# Patient Record
Sex: Female | Born: 1990 | Race: White | Hispanic: No | Marital: Single | Smoking: Never smoker
Health system: Southern US, Community
[De-identification: ages and names within clinical notes are randomized; demographics above are authoritative.]

---

## 2014-03-16 DIAGNOSIS — D229 Melanocytic nevi, unspecified: Secondary | ICD-10-CM

## 2014-03-16 HISTORY — DX: Melanocytic nevi, unspecified: D22.9

## 2016-05-25 DIAGNOSIS — N898 Other specified noninflammatory disorders of vagina: Secondary | ICD-10-CM | POA: Diagnosis not present

## 2016-06-21 DIAGNOSIS — Z3202 Encounter for pregnancy test, result negative: Secondary | ICD-10-CM | POA: Diagnosis not present

## 2016-06-21 DIAGNOSIS — Z23 Encounter for immunization: Secondary | ICD-10-CM | POA: Diagnosis not present

## 2016-08-21 DIAGNOSIS — Z23 Encounter for immunization: Secondary | ICD-10-CM | POA: Diagnosis not present

## 2016-08-21 DIAGNOSIS — Z6823 Body mass index (BMI) 23.0-23.9, adult: Secondary | ICD-10-CM | POA: Diagnosis not present

## 2016-10-31 DIAGNOSIS — F4322 Adjustment disorder with anxiety: Secondary | ICD-10-CM | POA: Diagnosis not present

## 2016-11-08 DIAGNOSIS — F4322 Adjustment disorder with anxiety: Secondary | ICD-10-CM | POA: Diagnosis not present

## 2016-11-14 DIAGNOSIS — F4322 Adjustment disorder with anxiety: Secondary | ICD-10-CM | POA: Diagnosis not present

## 2016-11-22 DIAGNOSIS — F4322 Adjustment disorder with anxiety: Secondary | ICD-10-CM | POA: Diagnosis not present

## 2016-11-26 DIAGNOSIS — F4322 Adjustment disorder with anxiety: Secondary | ICD-10-CM | POA: Diagnosis not present

## 2016-12-03 DIAGNOSIS — F4322 Adjustment disorder with anxiety: Secondary | ICD-10-CM | POA: Diagnosis not present

## 2016-12-25 DIAGNOSIS — F4322 Adjustment disorder with anxiety: Secondary | ICD-10-CM | POA: Diagnosis not present

## 2017-01-08 DIAGNOSIS — F4322 Adjustment disorder with anxiety: Secondary | ICD-10-CM | POA: Diagnosis not present

## 2017-01-22 DIAGNOSIS — F4322 Adjustment disorder with anxiety: Secondary | ICD-10-CM | POA: Diagnosis not present

## 2017-02-05 DIAGNOSIS — Z6823 Body mass index (BMI) 23.0-23.9, adult: Secondary | ICD-10-CM | POA: Diagnosis not present

## 2017-02-05 DIAGNOSIS — Z01419 Encounter for gynecological examination (general) (routine) without abnormal findings: Secondary | ICD-10-CM | POA: Diagnosis not present

## 2017-02-05 DIAGNOSIS — Z23 Encounter for immunization: Secondary | ICD-10-CM | POA: Diagnosis not present

## 2017-02-05 DIAGNOSIS — F4322 Adjustment disorder with anxiety: Secondary | ICD-10-CM | POA: Diagnosis not present

## 2017-02-05 DIAGNOSIS — Z113 Encounter for screening for infections with a predominantly sexual mode of transmission: Secondary | ICD-10-CM | POA: Diagnosis not present

## 2017-02-19 DIAGNOSIS — F4322 Adjustment disorder with anxiety: Secondary | ICD-10-CM | POA: Diagnosis not present

## 2017-03-12 DIAGNOSIS — F4322 Adjustment disorder with anxiety: Secondary | ICD-10-CM | POA: Diagnosis not present

## 2017-04-04 DIAGNOSIS — F4322 Adjustment disorder with anxiety: Secondary | ICD-10-CM | POA: Diagnosis not present

## 2017-04-18 DIAGNOSIS — J029 Acute pharyngitis, unspecified: Secondary | ICD-10-CM | POA: Diagnosis not present

## 2017-05-08 DIAGNOSIS — F4322 Adjustment disorder with anxiety: Secondary | ICD-10-CM | POA: Diagnosis not present

## 2017-05-13 ENCOUNTER — Other Ambulatory Visit: Payer: Self-pay | Admitting: Family Medicine

## 2017-05-13 ENCOUNTER — Ambulatory Visit
Admission: RE | Admit: 2017-05-13 | Discharge: 2017-05-13 | Disposition: A | Discharge status: Home / Self Care | Payer: BLUE CROSS/BLUE SHIELD | Source: Ambulatory Visit | Attending: Family Medicine | Admitting: Family Medicine

## 2017-05-13 DIAGNOSIS — K5900 Constipation, unspecified: Principal | ICD-10-CM

## 2017-05-13 DIAGNOSIS — K5909 Other constipation: Secondary | ICD-10-CM

## 2017-05-13 DIAGNOSIS — R109 Unspecified abdominal pain: Secondary | ICD-10-CM | POA: Diagnosis not present

## 2017-05-13 DIAGNOSIS — K219 Gastro-esophageal reflux disease without esophagitis: Secondary | ICD-10-CM | POA: Diagnosis not present

## 2017-05-13 DIAGNOSIS — Z6824 Body mass index (BMI) 24.0-24.9, adult: Secondary | ICD-10-CM | POA: Diagnosis not present

## 2017-05-13 DIAGNOSIS — R140 Abdominal distension (gaseous): Secondary | ICD-10-CM | POA: Diagnosis not present

## 2017-05-13 DIAGNOSIS — Z3041 Encounter for surveillance of contraceptive pills: Secondary | ICD-10-CM | POA: Diagnosis not present

## 2017-05-13 DIAGNOSIS — R197 Diarrhea, unspecified: Secondary | ICD-10-CM | POA: Diagnosis not present

## 2017-05-22 DIAGNOSIS — F4322 Adjustment disorder with anxiety: Secondary | ICD-10-CM | POA: Diagnosis not present

## 2017-05-23 DIAGNOSIS — R140 Abdominal distension (gaseous): Secondary | ICD-10-CM | POA: Diagnosis not present

## 2017-05-23 DIAGNOSIS — Z6824 Body mass index (BMI) 24.0-24.9, adult: Secondary | ICD-10-CM | POA: Diagnosis not present

## 2017-05-23 DIAGNOSIS — K5900 Constipation, unspecified: Secondary | ICD-10-CM | POA: Diagnosis not present

## 2017-05-23 DIAGNOSIS — A048 Other specified bacterial intestinal infections: Secondary | ICD-10-CM | POA: Diagnosis not present

## 2017-06-12 DIAGNOSIS — F4322 Adjustment disorder with anxiety: Secondary | ICD-10-CM | POA: Diagnosis not present

## 2017-06-18 DIAGNOSIS — K219 Gastro-esophageal reflux disease without esophagitis: Secondary | ICD-10-CM | POA: Diagnosis not present

## 2017-06-18 DIAGNOSIS — A048 Other specified bacterial intestinal infections: Secondary | ICD-10-CM | POA: Diagnosis not present

## 2017-06-18 DIAGNOSIS — K5900 Constipation, unspecified: Secondary | ICD-10-CM | POA: Diagnosis not present

## 2017-06-18 DIAGNOSIS — Z6824 Body mass index (BMI) 24.0-24.9, adult: Secondary | ICD-10-CM | POA: Diagnosis not present

## 2017-06-26 DIAGNOSIS — F4322 Adjustment disorder with anxiety: Secondary | ICD-10-CM | POA: Diagnosis not present

## 2017-07-08 DIAGNOSIS — F4322 Adjustment disorder with anxiety: Secondary | ICD-10-CM | POA: Diagnosis not present

## 2017-07-31 DIAGNOSIS — F4322 Adjustment disorder with anxiety: Secondary | ICD-10-CM | POA: Diagnosis not present

## 2017-08-14 DIAGNOSIS — F4322 Adjustment disorder with anxiety: Secondary | ICD-10-CM | POA: Diagnosis not present

## 2017-09-17 DIAGNOSIS — F4322 Adjustment disorder with anxiety: Secondary | ICD-10-CM | POA: Diagnosis not present

## 2017-11-04 DIAGNOSIS — F4322 Adjustment disorder with anxiety: Secondary | ICD-10-CM | POA: Diagnosis not present

## 2017-11-12 DIAGNOSIS — F4322 Adjustment disorder with anxiety: Secondary | ICD-10-CM | POA: Diagnosis not present

## 2017-12-19 DIAGNOSIS — F4322 Adjustment disorder with anxiety: Secondary | ICD-10-CM | POA: Diagnosis not present

## 2017-12-31 DIAGNOSIS — F4322 Adjustment disorder with anxiety: Secondary | ICD-10-CM | POA: Diagnosis not present

## 2018-01-21 DIAGNOSIS — F4322 Adjustment disorder with anxiety: Secondary | ICD-10-CM | POA: Diagnosis not present

## 2018-02-13 DIAGNOSIS — F4322 Adjustment disorder with anxiety: Secondary | ICD-10-CM | POA: Diagnosis not present

## 2018-02-24 DIAGNOSIS — Z6825 Body mass index (BMI) 25.0-25.9, adult: Secondary | ICD-10-CM | POA: Diagnosis not present

## 2018-02-24 DIAGNOSIS — Z01419 Encounter for gynecological examination (general) (routine) without abnormal findings: Secondary | ICD-10-CM | POA: Diagnosis not present

## 2018-03-11 DIAGNOSIS — F4322 Adjustment disorder with anxiety: Secondary | ICD-10-CM | POA: Diagnosis not present

## 2018-04-21 DIAGNOSIS — K581 Irritable bowel syndrome with constipation: Secondary | ICD-10-CM | POA: Diagnosis not present

## 2018-10-23 IMAGING — CR DG ABDOMEN 2V
2 series · 2 of 2 positions shown · non-contrast
Comparison: None.

CLINICAL DATA: Episodes of bloating, abd discomfort,and
constipation x 2 years / no chance PG / concern for stool burden /
jdh [id number]

EXAM:
ABDOMEN - 2 VIEW

[w abdomen upright]
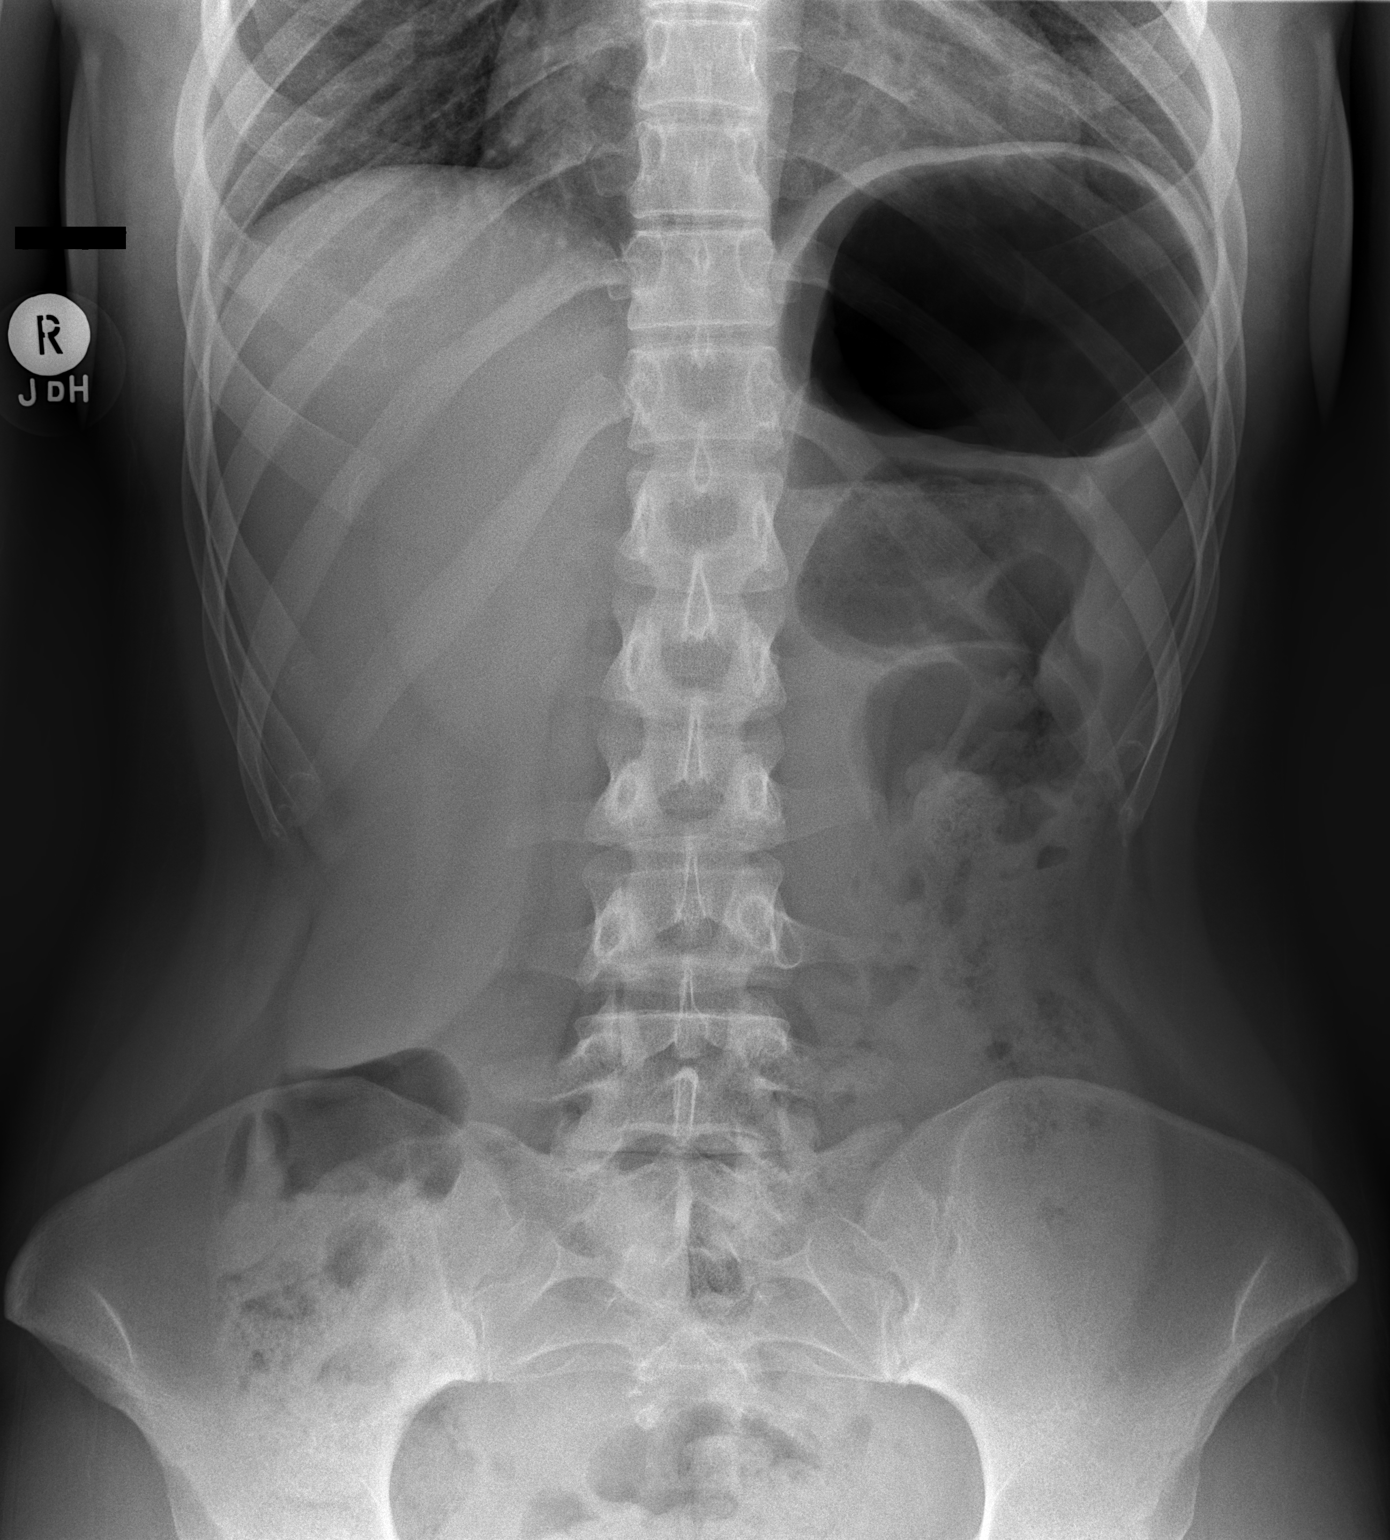

[t abdomen supine]
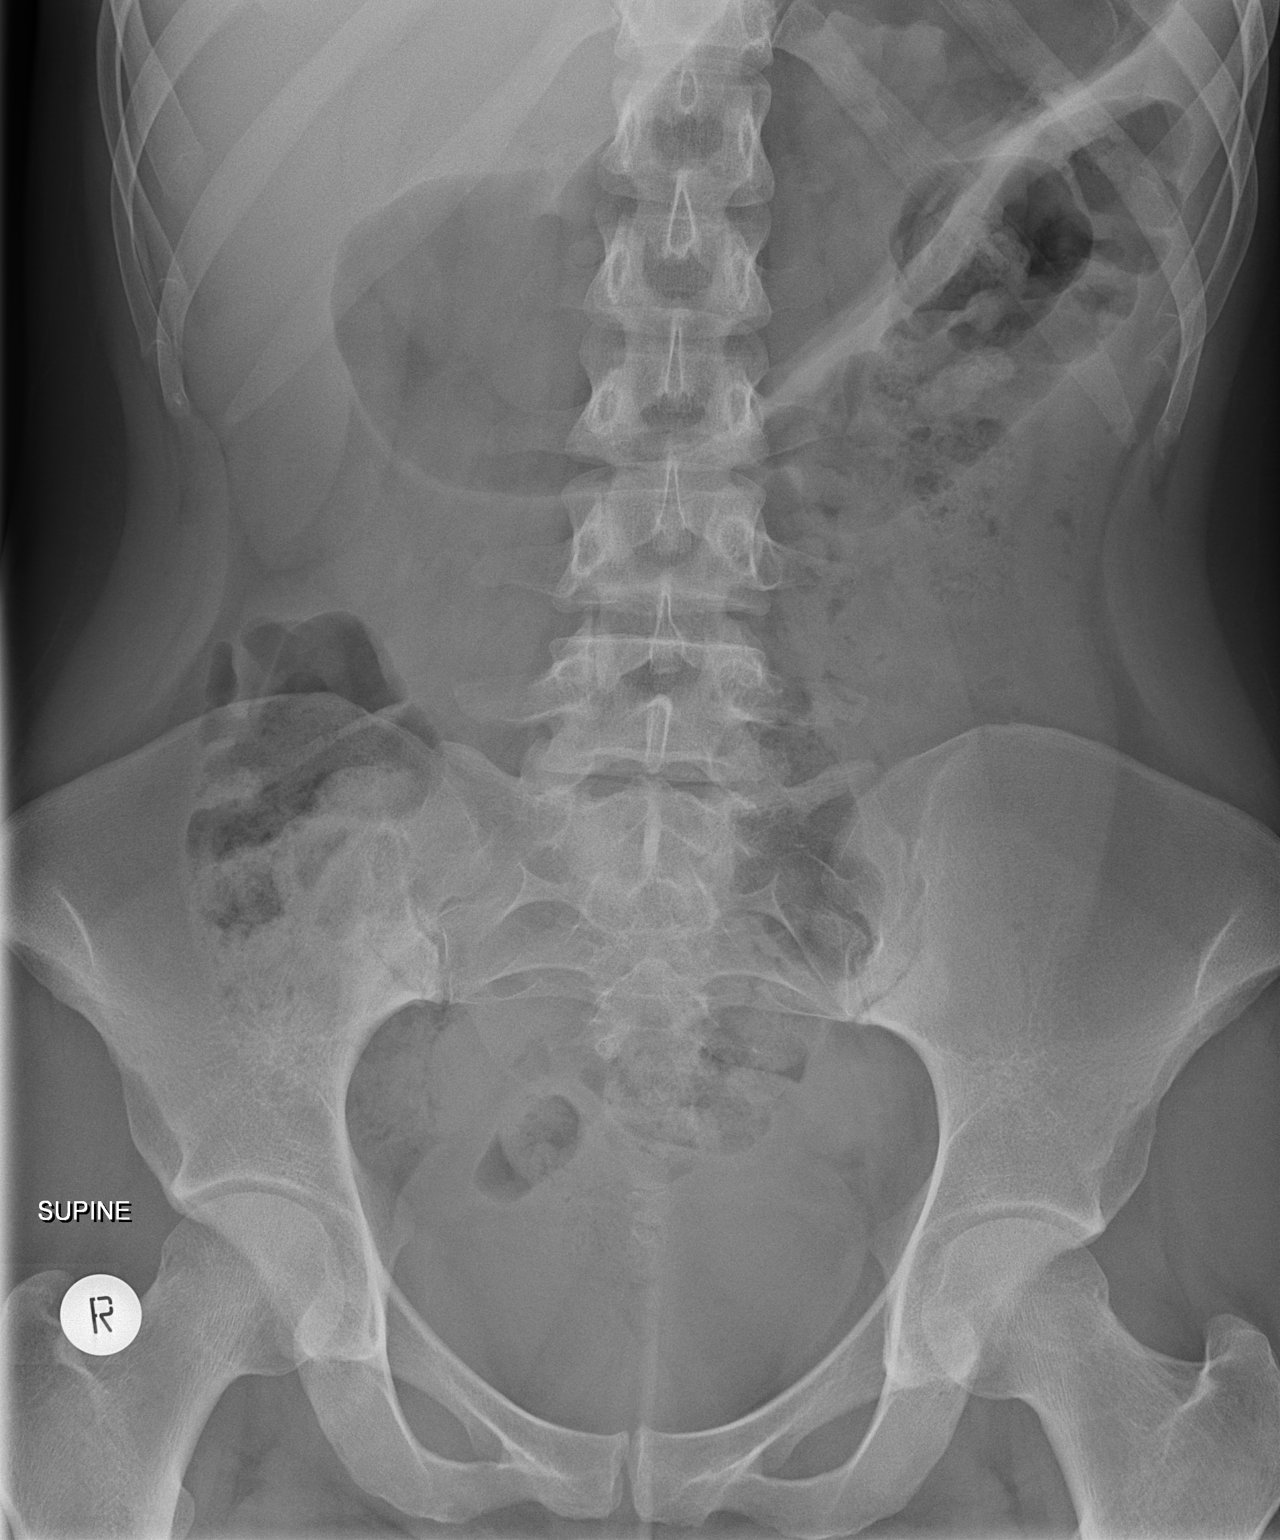

[2 of 2 positions shown; findings below may reference images not displayed]

FINDINGS: Visualized lung bases clear.

No free air.

Stomach is distended by gas and ingested material. Small bowel
decompressed. Moderate fecal material in the proximal colon,
decompressed distally. No abnormal abdominal calcifications.

Regional bones unremarkable.
IMPRESSION: Nonobstructive bowel gas pattern with moderate proximal colonic
fecal material.

## 2018-12-22 DIAGNOSIS — Z20828 Contact with and (suspected) exposure to other viral communicable diseases: Secondary | ICD-10-CM | POA: Diagnosis not present

## 2019-02-14 DIAGNOSIS — Z20828 Contact with and (suspected) exposure to other viral communicable diseases: Secondary | ICD-10-CM | POA: Diagnosis not present

## 2019-02-14 DIAGNOSIS — R519 Headache, unspecified: Secondary | ICD-10-CM | POA: Diagnosis not present

## 2019-02-14 DIAGNOSIS — R0981 Nasal congestion: Secondary | ICD-10-CM | POA: Diagnosis not present

## 2019-02-14 DIAGNOSIS — R05 Cough: Secondary | ICD-10-CM | POA: Diagnosis not present

## 2019-03-02 DIAGNOSIS — Z6823 Body mass index (BMI) 23.0-23.9, adult: Secondary | ICD-10-CM | POA: Diagnosis not present

## 2019-03-02 DIAGNOSIS — Z124 Encounter for screening for malignant neoplasm of cervix: Secondary | ICD-10-CM | POA: Diagnosis not present

## 2019-03-02 DIAGNOSIS — Z01419 Encounter for gynecological examination (general) (routine) without abnormal findings: Secondary | ICD-10-CM | POA: Diagnosis not present

## 2019-07-28 ENCOUNTER — Other Ambulatory Visit: Payer: Self-pay

## 2019-07-28 ENCOUNTER — Ambulatory Visit (INDEPENDENT_AMBULATORY_CARE_PROVIDER_SITE_OTHER): Payer: BLUE CROSS/BLUE SHIELD | Admitting: Physician Assistant

## 2019-07-28 ENCOUNTER — Encounter: Payer: Self-pay | Admitting: Physician Assistant

## 2019-07-28 DIAGNOSIS — L219 Seborrheic dermatitis, unspecified: Secondary | ICD-10-CM | POA: Diagnosis not present

## 2019-07-28 DIAGNOSIS — L858 Other specified epidermal thickening: Secondary | ICD-10-CM

## 2019-07-28 DIAGNOSIS — Z1283 Encounter for screening for malignant neoplasm of skin: Secondary | ICD-10-CM | POA: Diagnosis not present

## 2019-07-28 MED ORDER — KETOCONAZOLE 2 % EX SHAM: 1.0000 "application " | MEDICATED_SHAMPOO | Freq: Once | CUTANEOUS | 10 refills | Status: AC

## 2019-07-28 MED ORDER — RHOFADE 1 % EX CREA: 1.0000 "application " | TOPICAL_CREAM | Freq: Every day | CUTANEOUS | 0 refills | Status: AC

## 2019-07-28 MED ORDER — CLOBETASOL PROPIONATE 0.05 % EX SOLN: 1.0000 "application " | Freq: Two times a day (BID) | CUTANEOUS | 10 refills | Status: AC

## 2019-07-28 NOTE — Progress Notes (Signed)
   New Patient   Subjective  Virginia Hendricks is a 29 y.o. female who presents for the following: Annual Exam (Patient here today for skin check. Patient has a new mole on her left ring finger x few months, patient denies bleeding.  Patient also wants to know what she can do for the redness in her face x years patient has tired a redness reducing lotion.  Patient also has a question about possible dry scalp x years patient has tried OTC shampoo's comes and goes.).   Location: scalp  Duration: years off and on Quality: scaly and red Associated Signs/Symptoms: itch Modifying Factors: OTC tea tree shampoo Severity:  Worse right now    The following portions of the chart were reviewed this encounter and updated as appropriate: Tobacco  Allergies  Meds  Problems  Med Hx  Surg Hx  Fam Hx      Objective  Well appearing patient in no apparent distress; mood and affect are within normal limits.  A full examination was performed including scalp, head, eyes, ears, nose, lips, neck, chest, axillae, abdomen, back, buttocks, bilateral upper extremities, bilateral lower extremities, hands, feet, fingers, toes, fingernails, and toenails. All findings within normal limits unless otherwise noted below.  Objective  Head - Anterior (Face) (3), Left Shoulder - Anterior, Left Thigh - Anterior, Right Shoulder - Anterior, Right Thigh - Anterior: Tiny follicular keratotic papules.   Objective  head to toe: No DN   Assessment & Plan  Keratosis pilaris (7) Head - Anterior (Face) (3); Left Shoulder - Anterior; Right Shoulder - Anterior; Left Thigh - Anterior; Right Thigh - Anterior  Samples of rhofade  Ordered Medications: Oxymetazoline HCl (RHOFADE) 1 % CREA  Screening exam for skin cancer head to toe  Yearly skin checks.  Seborrheic dermatitis (6) Mid Parietal Scalp; Mid Occipital Scalp (2); Mid Forehead; Left Forehead; Right Forehead  Ordered Medications: ketoconazole (NIZORAL) 2 %  shampoo clobetasol (TEMOVATE) 0.05 % external solution

## 2020-03-03 DIAGNOSIS — J069 Acute upper respiratory infection, unspecified: Secondary | ICD-10-CM | POA: Diagnosis not present

## 2020-03-03 DIAGNOSIS — Z20822 Contact with and (suspected) exposure to covid-19: Secondary | ICD-10-CM | POA: Diagnosis not present

## 2020-03-14 DIAGNOSIS — Z01419 Encounter for gynecological examination (general) (routine) without abnormal findings: Secondary | ICD-10-CM | POA: Diagnosis not present

## 2020-04-01 DIAGNOSIS — K581 Irritable bowel syndrome with constipation: Secondary | ICD-10-CM | POA: Diagnosis not present

## 2020-04-01 DIAGNOSIS — K625 Hemorrhage of anus and rectum: Secondary | ICD-10-CM | POA: Diagnosis not present

## 2020-04-01 DIAGNOSIS — K649 Unspecified hemorrhoids: Secondary | ICD-10-CM | POA: Diagnosis not present

## 2020-04-01 DIAGNOSIS — R1013 Epigastric pain: Secondary | ICD-10-CM | POA: Diagnosis not present
